# Patient Record
Sex: Male | Born: 1985 | Race: Black or African American | Hispanic: No | Marital: Single | Smoking: Never smoker
Health system: Southern US, Community
[De-identification: ages and names within clinical notes are randomized; demographics above are authoritative.]

---

## 2018-08-03 ENCOUNTER — Emergency Department
Admission: EM | Admit: 2018-08-03 | Discharge: 2018-08-03 | Disposition: A | Discharge status: Home / Self Care | Payer: Self-pay | Attending: Emergency Medicine | Admitting: Emergency Medicine

## 2018-08-03 ENCOUNTER — Encounter: Payer: Self-pay | Admitting: Emergency Medicine

## 2018-08-03 ENCOUNTER — Other Ambulatory Visit: Payer: Self-pay

## 2018-08-03 DIAGNOSIS — Z7689 Persons encountering health services in other specified circumstances: Secondary | ICD-10-CM

## 2018-08-03 DIAGNOSIS — Z0279 Encounter for issue of other medical certificate: Secondary | ICD-10-CM | POA: Insufficient documentation

## 2018-08-03 NOTE — ED Notes (Signed)
Reports just needs a note for work, reports had abd pain and stayed home from work, now can not return to work without medical clearance.

## 2018-08-03 NOTE — ED Provider Notes (Signed)
[facility] Emergency Department Provider Note  ____________________________________________   First MD Initiated Contact with Patient 08/03/18 1402     (approximate)  I have reviewed the triage vital signs and the nursing notes.   HISTORY  Chief Complaint work note    HPI Adam Robinson is a 33 y.o. male presents emergency department requesting a work note.  He states he had abdominal pain with a lot of gas and stayed home from work.  He states his boss told him he cannot return without medical clearance.  He has had no vomiting or diarrhea.  No other abdominal pain since that day.    History reviewed. No pertinent past medical history.  There are no active problems to display for this patient.   History reviewed. No pertinent surgical history.  Prior to Admission medications   Not on File    Allergies Penicillins  No family history on file.  Social History Social History   Tobacco Use   Smoking status: Not on file  Substance Use Topics   Alcohol use: Not on file   Drug use: Not on file    Review of Systems  Constitutional: No fever/chills, requesting work note Eyes: No visual changes. ENT: No sore throat. Respiratory: Denies cough Genitourinary: Negative for dysuria. Musculoskeletal: Negative for back pain. Skin: Negative for rash.    ____________________________________________   PHYSICAL EXAM:  VITAL SIGNS: ED Triage Vitals  Enc Vitals Group     BP 08/03/18 1341 107/70     Pulse Rate 08/03/18 1341 79     Resp 08/03/18 1341 16     Temp 08/03/18 1341 98.7 F (37.1 C)     Temp Source 08/03/18 1341 Oral     SpO2 08/03/18 1341 97 %     Weight --      Height --      Head Circumference --      Peak Flow --      Pain Score 08/03/18 1335 0     Pain Loc --      Pain Edu? --      Excl. in [place]? --     Constitutional: Alert and oriented. Well appearing and in no acute distress. Eyes: Conjunctivae are normal.   Head: Atraumatic. Nose: No congestion/rhinnorhea. Mouth/Throat: Mucous membranes are moist.   Neck:  supple no lymphadenopathy noted Cardiovascular: Normal rate, regular rhythm.  Respiratory: Normal respiratory effort.  No retractions,  Abd: soft nontender bs normal all 4 quad GU: deferred Musculoskeletal: FROM all extremities, warm and well perfused Neurologic:  Normal speech and language.  Skin:  Skin is warm, dry and intact. No rash noted. Psychiatric: Mood and affect are normal. Speech and behavior are normal.  ____________________________________________   LABS (all labs ordered are listed, but only abnormal results are displayed)  Labs Reviewed - No data to display ____________________________________________   ____________________________________________  RADIOLOGY    ____________________________________________   PROCEDURES  Procedure(s) performed: No  Procedures    ____________________________________________   INITIAL IMPRESSION / ASSESSMENT AND PLAN / ED COURSE  Pertinent labs & imaging results that were available during my care of the patient were reviewed by me and considered in my medical decision making (see chart for details).   Patient is 33 year old male presents emergency department requesting a work note.  He had gas and his employer told him he needed to be evaluated with a doctor's note for return to work.  Physical exam patient appears well.  Patient was given a  work note.  He was discharged stable condition.    Adam Robinson was evaluated in Emergency Department on 08/03/2018 for the symptoms described in the history of present illness. He was evaluated in the context of the global COVID-19 pandemic, which necessitated consideration that the patient might be at risk for infection with the SARS-CoV-2 virus that causes COVID-19. Institutional protocols and algorithms that pertain to the evaluation of patients at risk for COVID-19 are in a  state of rapid change based on information released by regulatory bodies including the CDC and federal and state organizations. These policies and algorithms were followed during the patient's care in the ED.   As part of my medical decision making, I reviewed the following data within the electronic MEDICAL RECORD NUMBER Nursing notes reviewed and incorporated, Old chart reviewed, Notes from prior ED visits and [place] Controlled Substance Database  ____________________________________________   FINAL CLINICAL IMPRESSION(S) / ED DIAGNOSES  Final diagnoses:  Return to work evaluation      NEW MEDICATIONS STARTED DURING THIS VISIT:  New Prescriptions   No medications on file     Note:  This document was prepared using Dragon voice recognition software and may include unintentional dictation errors.    Faythe Ghee, PA-C 08/03/18 1420    Sharyn Creamer, MD 08/03/18 [phone]

## 2018-08-03 NOTE — ED Notes (Signed)
Patient evaluated and discharged to home.

## 2018-08-03 NOTE — Discharge Instructions (Signed)
Follow-up with your regular doctor or the acute care as needed.

## 2018-08-03 NOTE — ED Triage Notes (Signed)
Pt to ED via POV, pt states that on Thursday he had an upset stomach and had to leave work. Needs note so that he can go back to work. Pt is in NAD, has no symptoms at this time.

## 2019-06-26 ENCOUNTER — Ambulatory Visit: Payer: Self-pay
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
## 2019-06-30 ENCOUNTER — Emergency Department
Admission: EM | Admit: 2019-06-30 | Discharge: 2019-06-30 | Disposition: A | Discharge status: Home / Self Care | Payer: Self-pay | Attending: Emergency Medicine | Admitting: Emergency Medicine

## 2019-06-30 ENCOUNTER — Other Ambulatory Visit: Payer: Self-pay

## 2019-06-30 DIAGNOSIS — B9689 Other specified bacterial agents as the cause of diseases classified elsewhere: Secondary | ICD-10-CM | POA: Insufficient documentation

## 2019-06-30 DIAGNOSIS — F17200 Nicotine dependence, unspecified, uncomplicated: Secondary | ICD-10-CM | POA: Insufficient documentation

## 2019-06-30 DIAGNOSIS — L089 Local infection of the skin and subcutaneous tissue, unspecified: Secondary | ICD-10-CM | POA: Insufficient documentation

## 2019-06-30 MED ORDER — SULFAMETHOXAZOLE-TRIMETHOPRIM 800-160 MG PO TABS: 1.0000 | ORAL_TABLET | Freq: Two times a day (BID) | ORAL | 0 refills | Status: AC

## 2019-06-30 MED ORDER — HYDROXYZINE HCL 50 MG PO TABS: 50.0000 mg | ORAL_TABLET | Freq: Three times a day (TID) | ORAL | 0 refills | Status: AC | PRN
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
## 2019-06-30 NOTE — ED Notes (Signed)
See triage note, pt states "my lip is swollen, I'm not sure why". States swelling started 2 weeks ago. States he tried to cut it open to help it drain a couple days ago with a knife, states he cleaned it with peroxide and burned it beforehand.   Denies drainage from lip.  No swelling noted to lip at this time.
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
## 2019-06-30 NOTE — Discharge Instructions (Signed)
Follow discharge care instruction take medication as directed. °

## 2019-10-13 ENCOUNTER — Telehealth: Payer: Self-pay | Admitting: General Practice

## 2019-10-13 NOTE — Telephone Encounter (Signed)
Individual has been contacted 3+ times regarding ED referral. No further attempts to contact individual will be made. 

## 2019-11-16 ENCOUNTER — Emergency Department
Admission: EM | Admit: 2019-11-16 | Discharge: 2019-11-16 | Disposition: A | Discharge status: Home / Self Care | Payer: Self-pay | Attending: Emergency Medicine | Admitting: Emergency Medicine

## 2019-11-16 ENCOUNTER — Other Ambulatory Visit: Payer: Self-pay

## 2019-11-16 ENCOUNTER — Encounter: Payer: Self-pay | Admitting: Emergency Medicine

## 2019-11-16 ENCOUNTER — Emergency Department: Payer: Self-pay

## 2019-11-16 DIAGNOSIS — L03012 Cellulitis of left finger: Secondary | ICD-10-CM

## 2019-11-16 DIAGNOSIS — F17200 Nicotine dependence, unspecified, uncomplicated: Secondary | ICD-10-CM | POA: Insufficient documentation

## 2019-11-16 DIAGNOSIS — W228XXA Striking against or struck by other objects, initial encounter: Secondary | ICD-10-CM | POA: Insufficient documentation

## 2019-11-16 DIAGNOSIS — M86042 Acute hematogenous osteomyelitis, left hand: Secondary | ICD-10-CM

## 2019-11-16 DIAGNOSIS — Y999 Unspecified external cause status: Secondary | ICD-10-CM | POA: Insufficient documentation

## 2019-11-16 DIAGNOSIS — Y9389 Activity, other specified: Secondary | ICD-10-CM | POA: Insufficient documentation

## 2019-11-16 DIAGNOSIS — Y929 Unspecified place or not applicable: Secondary | ICD-10-CM | POA: Insufficient documentation

## 2019-11-16 LAB — CBC
HCT: 38.5 % — ABNORMAL LOW (ref 39.0–52.0)
Hemoglobin: 13.2 g/dL (ref 13.0–17.0)
MCH: 27.7 pg (ref 26.0–34.0)
MCHC: 34.3 g/dL (ref 30.0–36.0)
MCV: 80.9 fL (ref 80.0–100.0)
Platelets: 308 10*3/uL (ref 150–400)
RBC: 4.76 MIL/uL (ref 4.22–5.81)
RDW: 14.1 % (ref 11.5–15.5)
WBC: 7.6 10*3/uL (ref 4.0–10.5)
nRBC: 0 % (ref 0.0–0.2)

## 2019-11-16 LAB — SEDIMENTATION RATE: Sed Rate: 82 mm/hr — ABNORMAL HIGH (ref 0–15)

## 2019-11-16 LAB — C-REACTIVE PROTEIN: CRP: 0.7 mg/dL (ref <1.0)

## 2019-11-16 MED ORDER — KETOROLAC TROMETHAMINE 30 MG/ML IJ SOLN
30.0000 mg | Freq: Once | INTRAMUSCULAR | Status: AC
  Administered 2019-11-16: 30 mg via INTRAVENOUS
  Filled 2019-11-16: qty 1

## 2019-11-16 MED ORDER — LIDOCAINE HCL (PF) 1 % IJ SOLN
5.0000 mL | Freq: Once | INTRAMUSCULAR | Status: AC
  Administered 2019-11-16: 5 mL via INTRADERMAL
  Filled 2019-11-16: qty 5

## 2019-11-16 MED ORDER — OXYCODONE-ACETAMINOPHEN 5-325 MG PO TABS
1.0000 | ORAL_TABLET | Freq: Once | ORAL | Status: AC
  Administered 2019-11-16: 1 via ORAL
  Filled 2019-11-16: qty 1

## 2019-11-16 MED ORDER — OXYCODONE-ACETAMINOPHEN 5-325 MG PO TABS: 1.0000 | ORAL_TABLET | ORAL | 0 refills | Status: AC | PRN

## 2019-11-16 MED ORDER — LIDOCAINE HCL (PF) 1 % IJ SOLN
INTRAMUSCULAR | Status: AC
  Filled 2019-11-16: qty 5

## 2019-11-16 MED ORDER — SODIUM CHLORIDE 0.9 % IV SOLN
1.0000 g | Freq: Once | INTRAVENOUS | Status: AC
  Administered 2019-11-16: 1 g via INTRAVENOUS
  Filled 2019-11-16: qty 10

## 2019-11-16 MED ORDER — CEPHALEXIN 500 MG PO CAPS: 500.0000 mg | ORAL_CAPSULE | Freq: Four times a day (QID) | ORAL | 0 refills | Status: AC

## 2019-11-16 MED ORDER — DOXYCYCLINE HYCLATE 100 MG PO TABS
100.0000 mg | ORAL_TABLET | Freq: Once | ORAL | Status: AC
  Administered 2019-11-16: 100 mg via ORAL
  Filled 2019-11-16: qty 1

## 2019-11-16 MED ORDER — MORPHINE SULFATE (PF) 4 MG/ML IV SOLN
4.0000 mg | Freq: Once | INTRAVENOUS | Status: AC
  Administered 2019-11-16: 4 mg via INTRAVENOUS
  Filled 2019-11-16: qty 1

## 2019-11-16 MED ORDER — DOXYCYCLINE HYCLATE 100 MG PO TBEC: 100.0000 mg | DELAYED_RELEASE_TABLET | Freq: Two times a day (BID) | ORAL | 0 refills | Status: AC

## 2019-11-16 MED ORDER — ONDANSETRON HCL 4 MG/2ML IJ SOLN: 4.0000 mg | Freq: Once | INTRAMUSCULAR | Status: AC

## 2019-11-16 MED ORDER — ONDANSETRON HCL 4 MG/2ML IJ SOLN
INTRAMUSCULAR | Status: AC
  Administered 2019-11-16: 4 mg via INTRAVENOUS
  Filled 2019-11-16: qty 2

## 2019-11-16 NOTE — ED Provider Notes (Signed)
[facility] Emergency Department Provider Note  ____________________________________________   First MD Initiated Contact with Patient 11/16/19 1334     (approximate)  I have reviewed the triage vital signs and the nursing notes.   HISTORY  Chief Complaint Hand Pain   HPI Adam Robinson is a 34 y.o. male who presents to the emergency department for evaluation of left thumb pain. The patient states that he hit it on something approximately a week ago and for the last 5 to 6 days has had increasing pain and swelling about the distal left thumb. He notes discoloration under the nail bed and is concerned for infection. He denies weakness, fever, shortness of breath.         History reviewed. No pertinent past medical history.  There are no problems to display for this patient.   History reviewed. No pertinent surgical history.  Prior to Admission medications   Medication Sig Start Date End Date Taking? Authorizing Provider  cephALEXin (KEFLEX) 500 MG capsule Take 1 capsule (500 mg total) by mouth 4 (four) times daily for 10 days. 11/16/19 11/26/19  Lucy Chris, PA  doxycycline (DORYX) 100 MG EC tablet Take 1 tablet (100 mg total) by mouth 2 (two) times daily for 10 days. 11/16/19 11/26/19  Lucy Chris, PA  hydrOXYzine (ATARAX/VISTARIL) 50 MG tablet Take 1 tablet (50 mg total) by mouth 3 (three) times daily as needed for itching. 06/30/19   Joni Reining, PA-C  oxyCODONE-acetaminophen (PERCOCET) 5-325 MG tablet Take 1 tablet by mouth every 4 (four) hours as needed for severe pain. 11/16/19 11/15/20  Lucy Chris, PA  sulfamethoxazole-trimethoprim (BACTRIM DS) 800-160 MG tablet Take 1 tablet by mouth 2 (two) times daily. 06/30/19   Joni Reining, PA-C    Allergies Penicillins  No family history on file.  Social History Social History   Tobacco Use  . Smoking status: Current Every Day Smoker  Substance Use Topics  . Alcohol use: Yes  .  Drug use: Not on file    Review of Systems Constitutional: No fever/chills Eyes: No visual changes. ENT: No sore throat. Cardiovascular: Denies chest pain. Respiratory: Denies shortness of breath. Gastrointestinal: No abdominal pain.  No nausea, no vomiting.  No diarrhea.  No constipation. Genitourinary: Negative for dysuria. Musculoskeletal: + Left thumb pain, negative for back pain. Skin: Negative for rash. Neurological: Negative for headaches, focal weakness or numbness.   ____________________________________________   PHYSICAL EXAM:  VITAL SIGNS: ED Triage Vitals  Enc Vitals Group     BP 11/16/19 1054 120/85     Pulse Rate 11/16/19 1054 62     Resp 11/16/19 1054 17     Temp 11/16/19 1054 98.4 F (36.9 C)     Temp Source 11/16/19 1054 Oral     SpO2 11/16/19 1054 99 %     Weight 11/16/19 0951 175 lb (79.4 kg)     Height 11/16/19 0951 5\' 9"  (1.753 m)     Head Circumference --      Peak Flow --      Pain Score 11/16/19 0950 5     Pain Loc --      Pain Edu? --      Excl. in [place]? --     Constitutional: Alert and oriented. Well appearing and in no acute distress. Eyes: Conjunctivae are normal.  Head: Atraumatic. Nose: No congestion/rhinnorhea. Mouth/Throat: Mucous membranes are moist.  Oropharynx non-erythematous. Neck: No stridor.   Cardiovascular: Normal rate, regular rhythm.  Grossly normal heart sounds.  Good peripheral circulation. Respiratory: Normal respiratory effort.  No retractions. Lungs CTAB. Musculoskeletal: There is a moderate amount of swelling about the distal left thumb. There is a yellow color discoloration approximately halfway back the nail bed. The skin to the distal aspect of the thumb is a green/yellow color. It is tender to palpation over the distal phalanx of the left thumb. Neurologic:  Normal speech and language. No gross focal neurologic deficits are appreciated. No gait instability. Skin:  Skin is warm, dry and intact except as described  above. No rash noted. Psychiatric: Mood and affect are normal. Speech and behavior are normal.  ____________________________________________   LABS (all labs ordered are listed, but only abnormal results are displayed)  Labs Reviewed  CBC - Abnormal; Notable for the following components:      Result Value   HCT 38.5 (*)    All other components within normal limits  SEDIMENTATION RATE - Abnormal; Notable for the following components:   Sed Rate 82 (*)    All other components within normal limits  C-REACTIVE PROTEIN    ____________________________________________  RADIOLOGY   Official radiology report(s): DG Finger Thumb Left  Result Date: 11/16/2019 CLINICAL DATA:  Left thumb redness, pain, and swelling EXAM: LEFT THUMB 2+V COMPARISON:  None. FINDINGS: Small focal erosion of the distal tuft of the left thumb distal phalanx along its volar aspect centrally. There is associated soft tissue prominence. No soft tissue gas. No acute fracture. Joint spaces are maintained. No dislocation. IMPRESSION: Small focal erosion of the distal tuft of the left thumb distal phalanx suspicious for acute osteomyelitis. Electronically Signed   By: Duanne Guess D.O.   On: 11/16/2019 12:35    ____________________________________________   PROCEDURES  Procedure(s) performed (including Critical Care):  Procedures: See ED provider note from Dr. Erma Heritage.   ____________________________________________   INITIAL IMPRESSION / ASSESSMENT AND PLAN / ED COURSE  As part of my medical decision making, I reviewed the following data within the electronic MEDICAL RECORD NUMBER Nursing notes reviewed and incorporated, Radiograph reviewed of the left thumb, A consult was requested and obtained from this/these consultant(s) Orthopedics and [place] Controlled Substance Database        Adam Robinson is a 34 year old male who presents to the emergency department for acute left thumb pain x5 to 6 days. He states he  hit it on something and a few days later the pain and swelling started. On physical exam, the patient has a tender distal left thumb with obvious pus formation underneath the nailbed. X-rays performed and is concerning for early osteomyelitis of the distal phalanx. Adam Robinson from orthopedics was consulted, who recommended draining the felon and placing on antibiotics for outpatient follow-up. The case was discussed with Adam Pollack, MD who agreed to assist on the drainage procedure. See his procedure note. The patient was then given IV Rocephin as well as p.o. doxycycline before leaving the facility, and was given a prescription for p.o. Keflex and p.o. doxycycline x10 days as well as Percocet for home until he can follow-up with orthopedics. The patient is amenable with this plan.  Adam Robinson was evaluated in Emergency Department on 11/16/2019 for the symptoms described in the history of present illness. He was evaluated in the context of the global COVID-19 pandemic, which necessitated consideration that the patient might be at risk for infection with the SARS-CoV-2 virus that causes COVID-19. Institutional protocols and algorithms that pertain to the evaluation of patients at risk for  COVID-19 are in a state of rapid change based on information released by regulatory bodies including the CDC and federal and state organizations. These policies and algorithms were followed during the patient's care in the ED.       ____________________________________________   FINAL CLINICAL IMPRESSION(S) / ED DIAGNOSES  Final diagnoses:  Felon of finger of left hand  Acute hematogenous osteomyelitis of left hand [facility])     ED Discharge Orders         Ordered    cephALEXin (KEFLEX) 500 MG capsule  4 times daily        11/16/19 1510    doxycycline (DORYX) 100 MG EC tablet  2 times daily        11/16/19 1510    oxyCODONE-acetaminophen (PERCOCET) 5-325 MG tablet  Every 4 hours PRN        11/16/19  1510           Note:  This document was prepared using Dragon voice recognition software and may include unintentional dictation errors.    Lucy Chris, PA 11/16/19 1743    Adam Pollack, MD 11/18/19 1519    Adam Pollack, MD 11/18/19 [phone]

## 2019-11-16 NOTE — Consult Note (Signed)
Called by PA in ER regarding this 34 y/o male with approx 5 days of left thumb pain.  PA describes that the distal phalanx has the appearance of a felon.  I have recommended the ER staff drain the felon and put patient on oral antibiotics and have the patient follow up in our office later this week.   I have reviewed the xrays.  Given his young age I would not recommend surgical intervention immediately for a question of a small erosion in the tuft of the thumb, but rather treat initially with antibiotics.  Objective:   VITALS:   Vitals:   11/16/19 0951 11/16/19 1054  BP:  120/85  Pulse:  62  Resp:  17  Temp:  98.4 F (36.9 C)  TempSrc:  Oral  SpO2:  99%  Weight: 79.4 kg   Height: 5\' 9"  (1.753 m)     LABS  No results found for this or any previous visit (from the past 24 hour(s)).  DG Finger Thumb Left  Result Date: 11/16/2019 CLINICAL DATA:  Left thumb redness, pain, and swelling EXAM: LEFT THUMB 2+V COMPARISON:  None. FINDINGS: Small focal erosion of the distal tuft of the left thumb distal phalanx along its volar aspect centrally. There is associated soft tissue prominence. No soft tissue gas. No acute fracture. Joint spaces are maintained. No dislocation. IMPRESSION: Small focal erosion of the distal tuft of the left thumb distal phalanx suspicious for acute osteomyelitis. Electronically Signed   By: 11/18/2019 D.O.   On: 11/16/2019 12:35    Assessment/Plan:    Reported left thumb felon  Recommend I&D of left thumb felon.  Culture any fluid from I&D Discharge on antibiotics.   Follow up in the office this week.  [facility] [phone].  [phone] , MD 11/16/2019, 1:35 PM

## 2019-11-16 NOTE — ED Triage Notes (Signed)
Pt reports thinks he has an infection to his left hand thumb. Pt reports painful and swollen for 5=6 days.

## 2019-11-18 NOTE — ED Provider Notes (Signed)
..  Incision and Drainage  Date/Time: 11/18/2019 3:20 PM Performed by: Shaune Pollack, MD Authorized by: Shaune Pollack, MD   Consent:    Consent obtained:  Verbal   Consent given by:  Patient   Risks discussed:  Bleeding, damage to other organs, incomplete drainage, infection and pain   Alternatives discussed:  Alternative treatment and delayed treatment Location:    Type:  Abscess   Location:  Upper extremity   Upper extremity location:  Finger   Finger location:  L thumb Pre-procedure details:    Skin preparation:  Betadine Anesthesia (see MAR for exact dosages):    Anesthesia method:  Nerve block   Block needle gauge:  27 G   Block anesthetic:  Lidocaine 2% w/o epi   Block technique:  Digital block   Block injection procedure:  Anatomic landmarks palpated, anatomic landmarks identified, introduced needle, negative aspiration for blood and incremental injection   Block outcome:  Anesthesia achieved Procedure type:    Complexity:  Complex Procedure details:    Needle aspiration: no     Incision types:  Single straight   Incision depth:  Dermal   Scalpel blade:  11   Wound management:  Probed and deloculated and irrigated with saline   Drainage:  Purulent   Drainage amount:  Moderate   Wound treatment:  Wound left open   Packing materials:  None Post-procedure details:    Patient tolerance of procedure:  Tolerated well, no immediate complications      Shaune Pollack, MD 11/18/19 1521

## 2022-05-10 ENCOUNTER — Emergency Department (HOSPITAL_COMMUNITY): Payer: BC Managed Care – PPO

## 2022-05-10 ENCOUNTER — Other Ambulatory Visit: Payer: Self-pay

## 2022-05-10 ENCOUNTER — Encounter (HOSPITAL_COMMUNITY)
Admission: EM | Disposition: A | Discharge status: Other Acute Inpatient Hospital | Payer: Self-pay | Source: Home / Self Care | Attending: Emergency Medicine

## 2022-05-10 ENCOUNTER — Observation Stay (HOSPITAL_COMMUNITY)
Admission: EM | Admit: 2022-05-10 | Discharge: 2022-05-15 | Disposition: A | Discharge status: Other Acute Inpatient Hospital | Payer: BC Managed Care – PPO | Attending: Surgery | Admitting: Surgery

## 2022-05-10 ENCOUNTER — Encounter (HOSPITAL_COMMUNITY): Payer: Self-pay

## 2022-05-10 ENCOUNTER — Observation Stay (HOSPITAL_COMMUNITY): Payer: BC Managed Care – PPO | Admitting: Certified Registered Nurse Anesthetist

## 2022-05-10 DIAGNOSIS — F6589 Other paraphilias: Secondary | ICD-10-CM | POA: Diagnosis present

## 2022-05-10 DIAGNOSIS — X789XXA Intentional self-harm by unspecified sharp object, initial encounter: Secondary | ICD-10-CM | POA: Diagnosis present

## 2022-05-10 DIAGNOSIS — S1191XA Laceration without foreign body of unspecified part of neck, initial encounter: Secondary | ICD-10-CM | POA: Diagnosis present

## 2022-05-10 DIAGNOSIS — Z7289 Other problems related to lifestyle: Secondary | ICD-10-CM

## 2022-05-10 DIAGNOSIS — Z79899 Other long term (current) drug therapy: Secondary | ICD-10-CM | POA: Diagnosis not present

## 2022-05-10 DIAGNOSIS — W260XXA Contact with knife, initial encounter: Secondary | ICD-10-CM | POA: Diagnosis not present

## 2022-05-10 DIAGNOSIS — Z1152 Encounter for screening for COVID-19: Secondary | ICD-10-CM | POA: Diagnosis not present

## 2022-05-10 DIAGNOSIS — S1183XA Puncture wound without foreign body of other specified part of neck, initial encounter: Secondary | ICD-10-CM | POA: Diagnosis present

## 2022-05-10 DIAGNOSIS — Z23 Encounter for immunization: Secondary | ICD-10-CM | POA: Diagnosis not present

## 2022-05-10 HISTORY — PX: I & D EXTREMITY: SHX5045

## 2022-05-10 LAB — ETHANOL: Alcohol, Ethyl (B): <10 mg/dL (ref <10)

## 2022-05-10 LAB — CBC
HCT: 42.7 % (ref 39.0–52.0)
HCT: 40 % (ref 39.0–52.0)
Hemoglobin: 13.9 g/dL (ref 13.0–17.0)
Hemoglobin: 13.8 g/dL (ref 13.0–17.0)
MCH: 27.4 pg (ref 26.0–34.0)
MCH: 27.7 pg (ref 26.0–34.0)
MCHC: 32.6 g/dL (ref 30.0–36.0)
MCHC: 34.5 g/dL (ref 30.0–36.0)
MCV: 84.2 fL (ref 80.0–100.0)
MCV: 80.2 fL (ref 80.0–100.0)
Platelets: 370 10*3/uL (ref 150–400)
Platelets: 354 10*3/uL (ref 150–400)
RBC: 5.07 MIL/uL (ref 4.22–5.81)
RBC: 4.99 MIL/uL (ref 4.22–5.81)
RDW: 13.5 % (ref 11.5–15.5)
RDW: 13.5 % (ref 11.5–15.5)
WBC: 12.3 10*3/uL — ABNORMAL HIGH (ref 4.0–10.5)
WBC: 13.4 10*3/uL — ABNORMAL HIGH (ref 4.0–10.5)
nRBC: 0 % (ref 0.0–0.2)
nRBC: 0 % (ref 0.0–0.2)

## 2022-05-10 LAB — SALICYLATE LEVEL: Salicylate Lvl: <7 mg/dL — ABNORMAL LOW (ref 7.0–30.0)

## 2022-05-10 LAB — CREATININE, SERUM
Creatinine, Ser: 1.05 mg/dL (ref 0.61–1.24)
GFR, Estimated: >60 mL/min (ref >60)

## 2022-05-10 LAB — HIV ANTIBODY (ROUTINE TESTING W REFLEX): HIV Screen 4th Generation wRfx: NONREACTIVE

## 2022-05-10 LAB — COMPREHENSIVE METABOLIC PANEL
ALT: 29 U/L (ref 0–44)
AST: 34 U/L (ref 15–41)
Albumin: 4.2 g/dL (ref 3.5–5.0)
Alkaline Phosphatase: 50 U/L (ref 38–126)
Anion gap: 10 (ref 5–15)
BUN: 15 mg/dL (ref 6–20)
CO2: 24 mmol/L (ref 22–32)
Calcium: 9.4 mg/dL (ref 8.9–10.3)
Chloride: 105 mmol/L (ref 98–111)
Creatinine, Ser: 1.27 mg/dL — ABNORMAL HIGH (ref 0.61–1.24)
GFR, Estimated: >60 mL/min (ref >60)
Glucose, Bld: 134 mg/dL — ABNORMAL HIGH (ref 70–99)
Potassium: 3.9 mmol/L (ref 3.5–5.1)
Sodium: 139 mmol/L (ref 135–145)
Total Bilirubin: 0.4 mg/dL (ref 0.3–1.2)
Total Protein: 7.8 g/dL (ref 6.5–8.1)

## 2022-05-10 LAB — PROTIME-INR
INR: 1 (ref 0.8–1.2)
Prothrombin Time: 13.2 seconds (ref 11.4–15.2)

## 2022-05-10 LAB — ACETAMINOPHEN LEVEL: Acetaminophen (Tylenol), Serum: 10 ug/mL (ref 10–30)

## 2022-05-10 LAB — I-STAT CHEM 8, ED
BUN: 17 mg/dL (ref 6–20)
Calcium, Ion: 1.13 mmol/L — ABNORMAL LOW (ref 1.15–1.40)
Chloride: 106 mmol/L (ref 98–111)
Creatinine, Ser: 1.2 mg/dL (ref 0.61–1.24)
Glucose, Bld: 132 mg/dL — ABNORMAL HIGH (ref 70–99)
HCT: 43 % (ref 39.0–52.0)
Hemoglobin: 14.6 g/dL (ref 13.0–17.0)
Potassium: 3.7 mmol/L (ref 3.5–5.1)
Sodium: 143 mmol/L (ref 135–145)
TCO2: 25 mmol/L (ref 22–32)

## 2022-05-10 LAB — SAMPLE TO BLOOD BANK

## 2022-05-10 LAB — LACTIC ACID, PLASMA: Lactic Acid, Venous: 1.6 mmol/L (ref 0.5–1.9)

## 2022-05-10 SURGERY — IRRIGATION AND DEBRIDEMENT EXTREMITY
Anesthesia: General | Site: Neck

## 2022-05-10 MED ORDER — FENTANYL CITRATE (PF) 250 MCG/5ML IJ SOLN
INTRAMUSCULAR | Status: DC | PRN
  Administered 2022-05-10 (×3): 50 ug via INTRAVENOUS

## 2022-05-10 MED ORDER — OXYCODONE HCL 5 MG PO TABS
5.0000 mg | ORAL_TABLET | ORAL | Status: DC | PRN
  Administered 2022-05-10 – 2022-05-13 (×2): 10 mg via ORAL
  Administered 2022-05-14: 5 mg via ORAL
  Administered 2022-05-14: 10 mg via ORAL
  Filled 2022-05-10: qty 2
  Filled 2022-05-10: qty 1
  Filled 2022-05-10 (×2): qty 2

## 2022-05-10 MED ORDER — SUGAMMADEX SODIUM 200 MG/2ML IV SOLN
INTRAVENOUS | Status: DC | PRN
  Administered 2022-05-10: 200 mg via INTRAVENOUS

## 2022-05-10 MED ORDER — SUCCINYLCHOLINE CHLORIDE 200 MG/10ML IV SOSY
PREFILLED_SYRINGE | INTRAVENOUS | Status: DC | PRN
  Administered 2022-05-10: 140 mg via INTRAVENOUS

## 2022-05-10 MED ORDER — PHENYLEPHRINE 80 MCG/ML (10ML) SYRINGE FOR IV PUSH (FOR BLOOD PRESSURE SUPPORT)
PREFILLED_SYRINGE | INTRAVENOUS | Status: DC | PRN
  Administered 2022-05-10: 160 ug via INTRAVENOUS

## 2022-05-10 MED ORDER — ONDANSETRON HCL 4 MG/2ML IJ SOLN
INTRAMUSCULAR | Status: AC
  Filled 2022-05-10: qty 4

## 2022-05-10 MED ORDER — IOHEXOL 350 MG/ML SOLN
75.0000 mL | Freq: Once | INTRAVENOUS | Status: AC | PRN
  Administered 2022-05-10: 75 mL via INTRAVENOUS

## 2022-05-10 MED ORDER — DEXAMETHASONE SODIUM PHOSPHATE 10 MG/ML IJ SOLN
INTRAMUSCULAR | Status: AC
  Filled 2022-05-10: qty 1

## 2022-05-10 MED ORDER — ACETAMINOPHEN 325 MG PO TABS
650.0000 mg | ORAL_TABLET | Freq: Four times a day (QID) | ORAL | Status: DC
  Administered 2022-05-10 – 2022-05-15 (×6): 650 mg via ORAL
  Filled 2022-05-10 (×8): qty 2

## 2022-05-10 MED ORDER — TETANUS-DIPHTH-ACELL PERTUSSIS 5-2.5-18.5 LF-MCG/0.5 IM SUSY
0.5000 mL | PREFILLED_SYRINGE | Freq: Once | INTRAMUSCULAR | Status: AC
  Administered 2022-05-10: 0.5 mL via INTRAMUSCULAR

## 2022-05-10 MED ORDER — LIDOCAINE 2% (20 MG/ML) 5 ML SYRINGE
INTRAMUSCULAR | Status: DC | PRN
  Administered 2022-05-10: 80 mg via INTRAVENOUS

## 2022-05-10 MED ORDER — FENTANYL CITRATE (PF) 250 MCG/5ML IJ SOLN
INTRAMUSCULAR | Status: AC
  Filled 2022-05-10: qty 5

## 2022-05-10 MED ORDER — DEXMEDETOMIDINE HCL IN NACL 80 MCG/20ML IV SOLN
INTRAVENOUS | Status: DC | PRN
  Administered 2022-05-10: 4 ug via BUCCAL
  Administered 2022-05-10 (×3): 8 ug via BUCCAL

## 2022-05-10 MED ORDER — DOCUSATE SODIUM 100 MG PO CAPS
100.0000 mg | ORAL_CAPSULE | Freq: Two times a day (BID) | ORAL | Status: DC
  Administered 2022-05-10 – 2022-05-14 (×5): 100 mg via ORAL
  Filled 2022-05-10 (×7): qty 1

## 2022-05-10 MED ORDER — METOPROLOL TARTRATE 5 MG/5ML IV SOLN: 5.0000 mg | Freq: Four times a day (QID) | INTRAVENOUS | Status: DC | PRN

## 2022-05-10 MED ORDER — SODIUM CHLORIDE 0.9 % IR SOLN
Status: DC | PRN
  Administered 2022-05-10: 3000 mL

## 2022-05-10 MED ORDER — MIDAZOLAM HCL 2 MG/2ML IJ SOLN
INTRAMUSCULAR | Status: DC | PRN
  Administered 2022-05-10: 2 mg via INTRAVENOUS

## 2022-05-10 MED ORDER — CHLORHEXIDINE GLUCONATE 0.12 % MT SOLN
OROMUCOSAL | Status: AC
  Administered 2022-05-10: 15 mL via OROMUCOSAL
  Filled 2022-05-10: qty 15

## 2022-05-10 MED ORDER — 0.9 % SODIUM CHLORIDE (POUR BTL) OPTIME
TOPICAL | Status: DC | PRN
  Administered 2022-05-10: 1000 mL

## 2022-05-10 MED ORDER — CEFAZOLIN SODIUM-DEXTROSE 2-4 GM/100ML-% IV SOLN
2.0000 g | Freq: Once | INTRAVENOUS | Status: AC
  Administered 2022-05-10: 2 g via INTRAVENOUS

## 2022-05-10 MED ORDER — HYDROMORPHONE HCL 1 MG/ML IJ SOLN: 0.5000 mg | INTRAMUSCULAR | Status: DC | PRN

## 2022-05-10 MED ORDER — CHLORHEXIDINE GLUCONATE 0.12 % MT SOLN: 15.0000 mL | Freq: Once | OROMUCOSAL | Status: AC

## 2022-05-10 MED ORDER — ONDANSETRON 4 MG PO TBDP: 4.0000 mg | ORAL_TABLET | Freq: Four times a day (QID) | ORAL | Status: DC | PRN

## 2022-05-10 MED ORDER — LACTATED RINGERS IV SOLN: INTRAVENOUS | Status: DC

## 2022-05-10 MED ORDER — MIDAZOLAM HCL 2 MG/2ML IJ SOLN
INTRAMUSCULAR | Status: AC
  Filled 2022-05-10: qty 2

## 2022-05-10 MED ORDER — HEMOSTATIC AGENTS (NO CHARGE) OPTIME
TOPICAL | Status: DC | PRN
  Administered 2022-05-10: 1

## 2022-05-10 MED ORDER — ENOXAPARIN SODIUM 30 MG/0.3ML IJ SOSY
30.0000 mg | PREFILLED_SYRINGE | Freq: Two times a day (BID) | INTRAMUSCULAR | Status: DC
  Administered 2022-05-11 – 2022-05-14 (×3): 30 mg via SUBCUTANEOUS
  Filled 2022-05-10 (×5): qty 0.3

## 2022-05-10 MED ORDER — PROPOFOL 10 MG/ML IV BOLUS
INTRAVENOUS | Status: AC
  Filled 2022-05-10: qty 20

## 2022-05-10 MED ORDER — BUPIVACAINE HCL (PF) 0.25 % IJ SOLN
INTRAMUSCULAR | Status: AC
  Filled 2022-05-10: qty 30

## 2022-05-10 MED ORDER — DEXAMETHASONE SODIUM PHOSPHATE 10 MG/ML IJ SOLN
INTRAMUSCULAR | Status: DC | PRN
  Administered 2022-05-10: 10 mg via INTRAVENOUS

## 2022-05-10 MED ORDER — LIDOCAINE 2% (20 MG/ML) 5 ML SYRINGE
INTRAMUSCULAR | Status: AC
  Filled 2022-05-10: qty 10

## 2022-05-10 MED ORDER — ACETAMINOPHEN 10 MG/ML IV SOLN
1000.0000 mg | Freq: Once | INTRAVENOUS | Status: DC | PRN
  Administered 2022-05-10: 1000 mg via INTRAVENOUS

## 2022-05-10 MED ORDER — ACETAMINOPHEN 10 MG/ML IV SOLN
INTRAVENOUS | Status: AC
  Filled 2022-05-10: qty 100

## 2022-05-10 MED ORDER — MELATONIN 3 MG PO TABS
3.0000 mg | ORAL_TABLET | Freq: Every evening | ORAL | Status: DC | PRN
  Administered 2022-05-10 – 2022-05-12 (×3): 3 mg via ORAL
  Filled 2022-05-10 (×4): qty 1

## 2022-05-10 MED ORDER — ORAL CARE MOUTH RINSE: 15.0000 mL | Freq: Once | OROMUCOSAL | Status: AC

## 2022-05-10 MED ORDER — ONDANSETRON HCL 4 MG/2ML IJ SOLN
INTRAMUSCULAR | Status: DC | PRN
  Administered 2022-05-10: 4 mg via INTRAVENOUS

## 2022-05-10 MED ORDER — FENTANYL CITRATE (PF) 100 MCG/2ML IJ SOLN
25.0000 ug | INTRAMUSCULAR | Status: DC | PRN
  Administered 2022-05-10: 25 ug via INTRAVENOUS

## 2022-05-10 MED ORDER — ROCURONIUM BROMIDE 10 MG/ML (PF) SYRINGE
PREFILLED_SYRINGE | INTRAVENOUS | Status: DC | PRN
  Administered 2022-05-10: 50 mg via INTRAVENOUS

## 2022-05-10 MED ORDER — PROPOFOL 10 MG/ML IV BOLUS
INTRAVENOUS | Status: DC | PRN
  Administered 2022-05-10: 200 mg via INTRAVENOUS

## 2022-05-10 MED ORDER — HALOPERIDOL LACTATE 5 MG/ML IJ SOLN: 5.0000 mg | Freq: Four times a day (QID) | INTRAMUSCULAR | Status: DC | PRN

## 2022-05-10 MED ORDER — FENTANYL CITRATE (PF) 100 MCG/2ML IJ SOLN
INTRAMUSCULAR | Status: AC
  Filled 2022-05-10: qty 2

## 2022-05-10 MED ORDER — ONDANSETRON HCL 4 MG/2ML IJ SOLN: 4.0000 mg | Freq: Four times a day (QID) | INTRAMUSCULAR | Status: DC | PRN

## 2022-05-10 MED ORDER — CEFAZOLIN SODIUM-DEXTROSE 1-4 GM/50ML-% IV SOLN: 1.0000 g | Freq: Once | INTRAVENOUS | Status: DC

## 2022-05-10 SURGICAL SUPPLY — 35 items
BAG COUNTER SPONGE SURGICOUNT (BAG) ×1 IMPLANT
BNDG ELASTIC 4X5.8 VLCR STR LF (GAUZE/BANDAGES/DRESSINGS) IMPLANT
BNDG ELASTIC 6X5.8 VLCR STR LF (GAUZE/BANDAGES/DRESSINGS) IMPLANT
BNDG GAUZE DERMACEA FLUFF 4 (GAUZE/BANDAGES/DRESSINGS) IMPLANT
CANISTER SUCT 3000ML PPV (MISCELLANEOUS) ×1 IMPLANT
COVER SURGICAL LIGHT HANDLE (MISCELLANEOUS) ×1 IMPLANT
DERMABOND ADVANCED .7 DNX12 (GAUZE/BANDAGES/DRESSINGS) IMPLANT
DRAPE HALF SHEET 40X57 (DRAPES) IMPLANT
DRAPE LAPAROTOMY 100X72 PEDS (DRAPES) IMPLANT
ELECT REM PT RETURN 9FT ADLT (ELECTROSURGICAL) ×1
ELECTRODE REM PT RTRN 9FT ADLT (ELECTROSURGICAL) ×1 IMPLANT
GAUZE PAD ABD 8X10 STRL (GAUZE/BANDAGES/DRESSINGS) IMPLANT
GAUZE SPONGE 4X4 12PLY STRL LF (GAUZE/BANDAGES/DRESSINGS) ×1 IMPLANT
GLOVE BIOGEL M STRL SZ7.5 (GLOVE) ×1 IMPLANT
GLOVE INDICATOR 8.0 STRL GRN (GLOVE) ×2 IMPLANT
GOWN STRL REUS W/ TWL LRG LVL3 (GOWN DISPOSABLE) ×1 IMPLANT
GOWN STRL REUS W/TWL 2XL LVL3 (GOWN DISPOSABLE) ×1 IMPLANT
GOWN STRL REUS W/TWL LRG LVL3 (GOWN DISPOSABLE) ×1
HEMOSTAT SURGICEL 2X4 FIBR (HEMOSTASIS) IMPLANT
KIT BASIN OR (CUSTOM PROCEDURE TRAY) ×1 IMPLANT
KIT TURNOVER KIT B (KITS) ×1 IMPLANT
NS IRRIG 1000ML POUR BTL (IV SOLUTION) ×1 IMPLANT
PACK GENERAL/GYN (CUSTOM PROCEDURE TRAY) ×1 IMPLANT
PAD ARMBOARD 7.5X6 YLW CONV (MISCELLANEOUS) ×1 IMPLANT
PENCIL SMOKE EVACUATOR (MISCELLANEOUS) ×1 IMPLANT
SET CYSTO W/LG BORE CLAMP LF (SET/KITS/TRAYS/PACK) IMPLANT
STOCKINETTE IMPERVIOUS 9X36 MD (GAUZE/BANDAGES/DRESSINGS) IMPLANT
STOCKINETTE IMPERVIOUS LG (DRAPES) IMPLANT
SUCTION FRAZIER HANDLE 10FR (MISCELLANEOUS) ×1
SUCTION TUBE FRAZIER 10FR DISP (MISCELLANEOUS) IMPLANT
SUT MNCRL AB 4-0 PS2 18 (SUTURE) IMPLANT
SUT VIC AB 3-0 SH 8-18 (SUTURE) IMPLANT
TOWEL GREEN STERILE (TOWEL DISPOSABLE) ×1 IMPLANT
TOWEL GREEN STERILE FF (TOWEL DISPOSABLE) ×1 IMPLANT
UNDERPAD 30X36 HEAVY ABSORB (UNDERPADS AND DIAPERS) ×1 IMPLANT

## 2022-05-10 NOTE — ED Triage Notes (Addendum)
Pt BIBA from home Pt cut throat on left side, attempting to cut carotid. Trachea exposed. O2 100%. Pt can speak in full sentences 110 HR 126/97 17 RR

## 2022-05-10 NOTE — Plan of Care (Signed)
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
## 2022-05-10 NOTE — Progress Notes (Signed)
Patient has outstanding warrants in [place], [place]. Police request a phone call after psych evaluation to relay disposition.   Doran Durand -- [phone]

## 2022-05-10 NOTE — Progress Notes (Signed)
Patient arrived to room. Room prepared according to suicide protocol. Most of admission completed but patient kept saying that he wished he would have been able to kill himself and that he did not deserve to live. I discussed these issues with patient and encouraged him.
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
## 2022-05-10 NOTE — Transfer of Care (Signed)
Immediate Anesthesia Transfer of Care Note  Patient: [name]  Procedure(s) Performed: EXPLORATION OF NECK WOUND W/CLOSURE (Neck)  Patient Location: PACU  Anesthesia Type:General  Level of Consciousness: awake, drowsy, and patient cooperative  Airway & Oxygen Therapy: Patient Spontanous Breathing  Post-op Assessment: Report given to RN and Post -op Vital signs reviewed and stable  Post vital signs: Reviewed and stable  Last Vitals:  Vitals Value Taken Time  BP 137/76 05/10/22 1541  Temp    Pulse 80 05/10/22 1544  Resp 24 05/10/22 1544  SpO2 94 % 05/10/22 1544  Vitals shown include unvalidated device data.  Last Pain:  Vitals:   05/10/22 1429  TempSrc:   PainSc: 2       Patients Stated Pain Goal: 0 ([id number])  Complications: No notable events documented.
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
## 2022-05-10 NOTE — TOC CAGE-AID Note (Incomplete)
Transition of Care [facility]) - CAGE-AID Screening   Patient Details  Name: Adam Robinson MRN: [id number] Date of Birth: 08/04/1985  Transition of [facility] CM/SW Contact:    Clovis Cao, RN Phone Number: [phone] 05/10/2022, 5:54 PM   Clinical Narrative: Pt here after slicing his neck after SI attempt.  Pt admits to smoking mariajuana but denies alcohol use or other recreational drug use.     CAGE-AID Screening:    Have You Ever Felt You Ought to Cut Down on Your Drinking or Drug Use?: No Have People Annoyed You By Critizing Your Drinking Or Drug Use?: No Have You Felt Bad Or Guilty About Your Drinking Or Drug Use?: No Have You Ever Had a Drink or Used Drugs First Thing In The Morning to Steady Your Nerves or to Get Rid of a Hangover?: No CAGE-AID Score: 0  Substance Abuse Education Offered: No

## 2022-05-10 NOTE — Anesthesia Preprocedure Evaluation (Addendum)
Anesthesia Evaluation  Patient identified by MRN, date of birth, ID band Patient awake    Reviewed: Allergy & Precautions, NPO status , Patient's Chart, lab work & pertinent test results  Airway Mallampati: II  TM Distance: >3 FB Neck ROM: Full    Dental no notable dental hx.    Pulmonary neg pulmonary ROS   Pulmonary exam normal        Cardiovascular negative cardio ROS  Rhythm:Regular Rate:Normal     Neuro/Psych negative neurological ROS  negative psych ROS   GI/Hepatic negative GI ROS, Neg liver ROS,,,  Endo/Other  negative endocrine ROS    Renal/GU negative Renal ROS  negative genitourinary   Musculoskeletal Neck laceration   Abdominal Normal abdominal exam  (+)   Peds  Hematology negative hematology ROS (+)   Anesthesia Other Findings   Reproductive/Obstetrics                             Anesthesia Physical Anesthesia Plan  ASA: 1 and emergent  Anesthesia Plan: General   Post-op Pain Management:    Induction: Intravenous and Rapid sequence  PONV Risk Score and Plan: 2 and Ondansetron, Dexamethasone, Midazolam and Treatment may vary due to age or medical condition  Airway Management Planned: Mask and Oral ETT  Additional Equipment: None  Intra-op Plan:   Post-operative Plan: Extubation in OR  Informed Consent: I have reviewed the patients History and Physical, chart, labs and discussed the procedure including the risks, benefits and alternatives for the proposed anesthesia with the patient or authorized representative who has indicated his/her understanding and acceptance.     Dental advisory given  Plan Discussed with: CRNA  Anesthesia Plan Comments: (Lab Results      Component                Value               Date                      WBC                      12.3 (H)            05/10/2022                HGB                      14.6                05/10/2022                 HCT                      43.0                05/10/2022                MCV                      84.2                05/10/2022                PLT                      370  05/10/2022             Lab Results      Component                Value               Date                      NA                       143                 05/10/2022                K                        3.7                 05/10/2022                GLUCOSE                  132 (H)             05/10/2022                BUN                      17                  05/10/2022                CREATININE               1.20                05/10/2022           )       Anesthesia Quick Evaluation

## 2022-05-10 NOTE — Progress Notes (Signed)
Orthopedic Tech Progress Note Patient Details:  Adam Robinson 03/26/1874 [id number]  Level 1 trauma, ortho techs present upon pt arrival.   Patient ID: Adam Robinson, male   DOB: 03/26/1874, 36 y.o.   MRN: [id number]  [name] 05/10/2022, 1:27 PM

## 2022-05-10 NOTE — Progress Notes (Signed)
   05/10/22 1325  Spiritual Encounters  Type of Visit Initial  Care provided to: Patient  Conversation partners present during encounter Nurse  Referral source Trauma page  Reason for visit Trauma  OnCall Visit No  Spiritual Framework  Presenting Themes Meaning/purpose/sources of inspiration;Values and beliefs;Significant life change (Suicide)  Community/Connection Family  Patient Stress Factors Exhausted;Family relationships;Financial concerns;Major life changes  Interventions  Spiritual Care Interventions Made Compassionate presence;Reflective listening;Normalization of emotions;Explored values/beliefs/practices/strengths;Prayer;Encouragement  Intervention Outcomes  Outcomes Connection to spiritual care   Chp responded to Trauma page.  Spoke with Pt after immediate medical needs were addressed.  Pt. Attempted suicide and was still expressing suicidal thoughts.  Chp spoke with pt about what lead up to suicide attempt. Pt. Expressed frustration with recent life events making him feel worthless and helpless.  Spoke of a pornography habit that "lead him down a dark path" according to him.  Pt. Expressed that he felt he was wicked and evil in his heart and mind.  Chp sought to encourage him that there was still hope and reason to live.  Chp. Reminded pt of his children. Pt spoke of early expose to christianity when he was young and asked for prayer. Chp prayed with patient.
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
## 2022-05-10 NOTE — Anesthesia Procedure Notes (Signed)
Procedure Name: Intubation Date/Time: 05/10/2022 2:45 PM  Performed by: Dorthea Cove, CRNAPre-anesthesia Checklist: Patient identified, Emergency Drugs available, Suction available and Patient being monitored Patient Re-evaluated:Patient Re-evaluated prior to induction Oxygen Delivery Method: Circle system utilized Preoxygenation: Pre-oxygenation with 100% oxygen Induction Type: IV induction, Cricoid Pressure applied and Rapid sequence Laryngoscope Size: Mac and 4 Grade View: Grade II Tube type: Oral Tube size: 7.5 mm Number of attempts: 1 Airway Equipment and Method: Stylet and Oral airway Placement Confirmation: ETT inserted through vocal cords under direct vision, positive ETCO2 and breath sounds checked- equal and bilateral Secured at: 23 cm Tube secured with: Tape Dental Injury: Teeth and Oropharynx as per pre-operative assessment
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
## 2022-05-11 ENCOUNTER — Encounter (HOSPITAL_COMMUNITY): Payer: Self-pay | Admitting: General Surgery

## 2022-05-11 DIAGNOSIS — F6589 Other paraphilias: Secondary | ICD-10-CM | POA: Diagnosis present

## 2022-05-11 DIAGNOSIS — X789XXA Intentional self-harm by unspecified sharp object, initial encounter: Secondary | ICD-10-CM | POA: Diagnosis present

## 2022-05-11 DIAGNOSIS — S1183XA Puncture wound without foreign body of other specified part of neck, initial encounter: Secondary | ICD-10-CM | POA: Diagnosis not present

## 2022-05-11 LAB — CBC
HCT: 40.1 % (ref 39.0–52.0)
Hemoglobin: 13.2 g/dL (ref 13.0–17.0)
MCH: 26.7 pg (ref 26.0–34.0)
MCHC: 32.9 g/dL (ref 30.0–36.0)
MCV: 81.2 fL (ref 80.0–100.0)
Platelets: 359 10*3/uL (ref 150–400)
RBC: 4.94 MIL/uL (ref 4.22–5.81)
RDW: 13.5 % (ref 11.5–15.5)
WBC: 11.5 10*3/uL — ABNORMAL HIGH (ref 4.0–10.5)
nRBC: 0 % (ref 0.0–0.2)

## 2022-05-11 LAB — BASIC METABOLIC PANEL
Anion gap: 11 (ref 5–15)
BUN: 10 mg/dL (ref 6–20)
CO2: 20 mmol/L — ABNORMAL LOW (ref 22–32)
Calcium: 9.3 mg/dL (ref 8.9–10.3)
Chloride: 105 mmol/L (ref 98–111)
Creatinine, Ser: 0.82 mg/dL (ref 0.61–1.24)
GFR, Estimated: >60 mL/min (ref >60)
Glucose, Bld: 122 mg/dL — ABNORMAL HIGH (ref 70–99)
Potassium: 3.8 mmol/L (ref 3.5–5.1)
Sodium: 136 mmol/L (ref 135–145)

## 2022-05-11 MED ORDER — POLYETHYLENE GLYCOL 3350 17 G PO PACK: 17.0000 g | PACK | Freq: Every day | ORAL | Status: DC | PRN

## 2022-05-11 NOTE — Progress Notes (Signed)
LCSW Progress Note  [id number]   Adam Robinson  05/11/2022  3:53 PM  Description:   Inpatient Psychiatric Referral  Patient was recommended inpatient per [name], MD. There are no available beds at [facility]. Patient was referred to the following facilities:   Destination  Service Provider Address Phone Fax  [address] [place] [address] [phone]  [facility] [place]  [place], [place] [address] [phone] [place]  [address] [place] [address] [phone]  [facility]  [place], [place] [address] [phone]  [facility]  [address] [place] [address] [phone] [place]  [place], [place] [address] [phone] [facility]  [id number] [place] [address] [phone] [facility]  [address] [place], [place] [address] [phone] [place] [address] [place] [address] [place]  [facility]  [address] [place] [address] [phone]  [facility]  [address] [place] [address] [phone]  [place] [place] [address] [phone]  [facility]  [place] [address] [phone]  [facility]  [address] [place] [address] [phone] [facility]  [address] [place] [address] [phone] [facility]  [address], [place] [address] [place]  [address] [place] [address] [phone]  [facility]  [address] [place] [address] [id number]  [facility] Office  [address], [place] [address] [phone]  [facility]  [address] [place] [address] [place]  [facility]  [address] [place] [address] [phone] [place]  [address], [place] [address] [phone]  [place]  [address] [place] [address] [id number]  [facility]  [address], [place] [address] [phone]  [facility]  [address] [place] [address] [phone]    Situation ongoing, CSW to continue following and update chart as more information becomes available.      Adam Robinson  05/11/2022 3:53 PM
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
## 2022-05-11 NOTE — Assessment & Plan Note (Signed)
Suicide attempt
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
## 2022-05-11 NOTE — Progress Notes (Signed)
Patient was somnolent  all day. He had his covers pulled over his head and stated that he just wanted to sleep. I offered him Tylenol and his scheduled meds but he stated that he was not in pain and just wanted to sleep.

## 2022-05-11 NOTE — Anesthesia Postprocedure Evaluation (Signed)
Anesthesia Post Note  Patient: Adam Robinson  Procedure(s) Performed: EXPLORATION OF NECK WOUND W/CLOSURE (Neck)     Patient location during evaluation: PACU Anesthesia Type: General Level of consciousness: awake and alert Pain management: pain level controlled Vital Signs Assessment: post-procedure vital signs reviewed and stable Respiratory status: spontaneous breathing, nonlabored ventilation, respiratory function stable and patient connected to nasal cannula oxygen Cardiovascular status: blood pressure returned to baseline and stable Postop Assessment: no apparent nausea or vomiting Anesthetic complications: no   No notable events documented.  Last Vitals:  Vitals:   05/11/22 0400 05/11/22 0738  BP: (!) 141/95 125/82  Pulse: 81 71  Resp: 20 17  Temp: 37 C 36.9 C  SpO2: 100% 98%    Last Pain:  Vitals:   05/11/22 0738  TempSrc: Oral  PainSc:                  March Rummage Shareese Macha
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
## 2022-05-11 NOTE — Plan of Care (Signed)
  Problem: Education: Goal: Knowledge of General Education information will improve Description: Including pain rating scale, medication(s)/side effects and non-pharmacologic comfort measures Outcome: Progressing   Problem: Education: Goal: Ability to make informed decisions regarding treatment will improve Outcome: Progressing   Problem: Self-Concept: Goal: Ability to disclose and discuss suicidal ideas will improve Outcome: Progressing

## 2022-05-11 NOTE — TOC CM/SW Note (Signed)
Note on chart states to call Doran Durand with update after psych eval; phone: [phone].  Information cannot be given to law enforcement without an order to disclose.  Will follow up with security director to ensure that information is shared appropriately and with correct documentation.   Reinaldo Raddle, RN, BSN  Trauma/Neuro ICU Case Manager [phone]

## 2022-05-12 DIAGNOSIS — S1183XA Puncture wound without foreign body of other specified part of neck, initial encounter: Secondary | ICD-10-CM | POA: Diagnosis not present

## 2022-05-12 MED ORDER — BOOST / RESOURCE BREEZE PO LIQD CUSTOM
1.0000 | Freq: Three times a day (TID) | ORAL | Status: DC
  Administered 2022-05-12 – 2022-05-15 (×7): 1 via ORAL

## 2022-05-12 MED ORDER — HYDROXYZINE HCL 25 MG PO TABS
25.0000 mg | ORAL_TABLET | Freq: Three times a day (TID) | ORAL | Status: DC
  Administered 2022-05-12 – 2022-05-15 (×9): 25 mg via ORAL
  Filled 2022-05-12 (×10): qty 1

## 2022-05-12 NOTE — Progress Notes (Signed)
Received a call from [place] from [facility]. She stated  they accepted the pt. She requested that pt will need to have IVC paperwork, UA, urine drug screen and Covid test done prior to transferring the pt. On call provider notified, order received for urine drug screen and COVID swab. Both test preformed and sent to lab. Pending result. Per [facility] phone number: [phone]. Fax Number: [phone]

## 2022-05-12 NOTE — Progress Notes (Signed)
Patient made aware that he can have a visitor one person at a time with 30 minute time restriction per DON instruction.
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
## 2022-05-12 NOTE — Plan of Care (Signed)
  Problem: Education: Goal: Knowledge of General Education information will improve Description: Including pain rating scale, medication(s)/side effects and non-pharmacologic comfort measures Outcome: Progressing   Problem: Health Behavior/Discharge Planning: Goal: Ability to manage health-related needs will improve Outcome: Progressing   Problem: Clinical Measurements: Goal: Ability to maintain clinical measurements within normal limits will improve Outcome: Progressing Goal: Will remain free from infection Outcome: Progressing Goal: Diagnostic test results will improve Outcome: Progressing Goal: Respiratory complications will improve Outcome: Progressing Goal: Cardiovascular complication will be avoided Outcome: Progressing   Problem: Activity: Goal: Risk for activity intolerance will decrease Outcome: Progressing   Problem: Nutrition: Goal: Adequate nutrition will be maintained Outcome: Progressing   Problem: Coping: Goal: Level of anxiety will decrease Outcome: Progressing   Problem: Elimination: Goal: Will not experience complications related to bowel motility Outcome: Progressing Goal: Will not experience complications related to urinary retention Outcome: Progressing   Problem: Pain Managment: Goal: General experience of comfort will improve Outcome: Progressing   Problem: Safety: Goal: Ability to remain free from injury will improve Outcome: Progressing   Problem: Skin Integrity: Goal: Risk for impaired skin integrity will decrease Outcome: Progressing   Problem: Education: Goal: Ability to make informed decisions regarding treatment will improve Outcome: Progressing   Problem: Self-Concept: Goal: Ability to disclose and discuss suicidal ideas will improve Outcome: Progressing Goal: Will verbalize positive feelings about self Outcome: Progressing
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
## 2022-05-13 DIAGNOSIS — S1183XA Puncture wound without foreign body of other specified part of neck, initial encounter: Secondary | ICD-10-CM | POA: Diagnosis not present

## 2022-05-13 LAB — URINALYSIS, ROUTINE W REFLEX MICROSCOPIC
Glucose, UA: NEGATIVE mg/dL
Hgb urine dipstick: NEGATIVE
Ketones, ur: 40 mg/dL — AB
Nitrite: NEGATIVE
Protein, ur: NEGATIVE mg/dL
Specific Gravity, Urine: 1.025 (ref 1.005–1.030)
pH: 6 (ref 5.0–8.0)

## 2022-05-13 LAB — URINALYSIS, MICROSCOPIC (REFLEX)

## 2022-05-13 LAB — SARS CORONAVIRUS 2 (TAT 6-24 HRS): SARS Coronavirus 2: NEGATIVE

## 2022-05-13 MED ORDER — METRONIDAZOLE 500 MG PO TABS
2000.0000 mg | ORAL_TABLET | Freq: Once | ORAL | Status: AC
  Administered 2022-05-13: 2000 mg via ORAL
  Filled 2022-05-13: qty 4

## 2022-05-13 NOTE — Progress Notes (Signed)
Per NT, patient has refused vital signs, also refused complete physical assessment.

## 2022-05-13 NOTE — Plan of Care (Signed)
  Problem: Coping: Goal: Level of anxiety will decrease Outcome: Not Progressing   

## 2022-05-13 NOTE — Progress Notes (Signed)
Andrey Campanile of [facility] Police [employer] notified that there was no ORDER TO DISCLOSE and there is no information to be shared without that. He asked if there was any way to email him a copy of one- that they do not use that in [facility].   Phone # [phone] Email- [email or web address]   Rolene Arbour, RN  Trauma Response Nurse [phone]

## 2022-05-13 NOTE — Progress Notes (Signed)
Cup and new urinal given to patient, instructed to collect urine for urinalysis as it wasn't done yet, patient not pleased to hear news as he already gave specimen earlier for urine drug screen, educated patient that result will come out fast as long as we get the specimen to laboratory.
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
## 2022-05-14 DIAGNOSIS — S1183XA Puncture wound without foreign body of other specified part of neck, initial encounter: Secondary | ICD-10-CM | POA: Diagnosis not present

## 2022-05-14 MED ORDER — HALOPERIDOL LACTATE 5 MG/ML IJ SOLN: 5.0000 mg | Freq: Four times a day (QID) | INTRAMUSCULAR | Status: DC | PRN

## 2022-05-14 MED ORDER — ACETAMINOPHEN 325 MG PO TABS: 650.0000 mg | ORAL_TABLET | Freq: Four times a day (QID) | ORAL | Status: AC | PRN

## 2022-05-14 MED ORDER — LIP MEDEX EX OINT
TOPICAL_OINTMENT | CUTANEOUS | Status: DC | PRN
  Filled 2022-05-14 (×2): qty 7

## 2022-05-14 MED ORDER — HYDROXYZINE HCL 25 MG PO TABS: 25.0000 mg | ORAL_TABLET | Freq: Three times a day (TID) | ORAL | 0 refills | Status: AC

## 2022-05-14 NOTE — Progress Notes (Signed)
Chaplain responded to Spiritual Care consult. Chaplain introduced spiritual care and acknowledged several consults placed over the weekend. Pt shared that he was surprised because chaplain had "popped up" on him. Chaplain acknowledged the difficulty of being a patient and beholden to someone else's schedule, often being surprised by people who want to speak to you right now and longing for visits when they're unavailable. Pt requested that chaplain return at another time. Chaplain inquired about possible transfer to another facility and pt stated that he thinks he is supposed to go to [facility] today. Chaplain encouraged pt to request chaplain be paged for a more timely response.  Please page as further needs arise.  Donald Prose. Elyn Peers, M.Div. [facility] Chaplain Pager [phone] Office [phone]

## 2022-05-14 NOTE — TOC Progression Note (Addendum)
Transition of Care [facility]) - Progression Note    Patient Details  Name: [name] MRN: [id number] Date of Birth: 09/08/1985  [facility]) CM/SW Contact  Oren Section Cleta Alberts, RN Phone Number: 05/14/2022, 10:05am  Clinical Narrative:    Received message that patient has been approved for admission to [facility] in [place].  Left message for intake to see if patient can be admitted today.   12:00 Addendum: Left additional message for intake at [facility], requesting call back.   3:50pm Addendum: Able to reach someone in the intake at [facility]: She states that there is no bed available for patient, as no follow-up information was sent to facility.  Notified provider and trauma team.  Faxed IVC paperwork, UA, urine drug screen, and results of COVID test to [phone].  Will provide updates as they are available.   Expected Discharge Plan: Psychiatric Hospital Barriers to Discharge: Continued Medical Work up  Expected Discharge Plan and Services   Discharge Planning Services: CM Consult   Living arrangements for the past 2 months: Single Family Home                                       Social Determinants of Health (SDOH) Interventions SDOH Screenings   Tobacco Use: Low Risk  (05/11/2022)    Readmission Risk Interventions     No data to display         Reinaldo Raddle, RN, BSN  Trauma/Neuro ICU Case Manager [phone]

## 2022-05-14 NOTE — Progress Notes (Addendum)
Inpatient Behavioral Health Placement  At 5:52pm CSW received request from provider Sheran Fava, FNP to assist with pt's disposition to seek inpatient behavioral health placement. Sheran Fava, FNP advised that pt no longer has accepting bed offer with [facility]. CSW informed provider Sheran Fava, FNP pt is a medical floor pt and this ED Psych Disposition CSW will assist with seeking bed placement this evening but will not continue to follow pt due to pt having assigned a TOC CSW or RN CM who will follow pt. This CSW will notify Case Manger Ellan Lambert, RN that CSW assisted this evening with faxing out. CSW will note chart and assist with placement during 2nd shift as requested.    CSW requested that pt be reviewed by [facility] Lynnda Shields, RN and CSW was informed that [facility] has no available beds. This CSW sent referral to out of network providers:   Destination  Service Provider Address Phone Fax  [address] [place] [address] [phone]  [facility] [place]  [place], [place] [address] [phone] [facility]  [address], [place] [address] [phone]  [facility]  [place], [place] [address] [phone]  [facility]  [address] [place] [address] [phone] [place]  [place], [place] [address] [phone] [facility]  [id number] [place] [address] [phone] [facility]  [address] [place], [place] [address] [phone] [place] [address] [place]  [facility]  [address] [place] [address]  [phone]  [facility]  [address] [place] [address] [phone]  [place] [place] [address] [phone]  [facility]  [place] [address] [phone]  [facility]  [address], [place] [address] [phone] [facility]  [address] [place] [address] [phone] [facility]  [address], [place] [address] [place]  [address] [place] [address] [phone]  [facility]  [address] [place] [address] [id number]  [facility]  [address] [place] [address] [phone]  [facility]  [address] [place] [address] [place]  [facility]  [address], [place] [address] [phone] [place]  [address] [place] [address] [phone]  [place]  [address] [place] [address] [place]  [facility]  [address], [place] [address] [phone] [facility]  [address] [place] [address] [place]  [facility]  [address] [place] [address] [phone] [facility]  [address], [place] [address] [id number]      -CSW received phone call from [facility] who denied pt due to documentation stating that pt may have dx of autism, which is a denying qualifier.     Case is working,  [employer] team to continue to assist.    [employer].  Sabra Heck, MSW, [facility] 05/15/2022 12:02 AM

## 2022-05-14 NOTE — Progress Notes (Signed)
Pt explained how important it was to him that his mother come visit today.  Pt stated "she makes me feel better than anyone and I want to see her".  Educated pt on IVC and SI policy.  Pt did very well today for me and stated he was willing to do whatever he needed to do to get better.  Pt was compliant in all patient care.  Notified Psychiatry to follow up.

## 2022-05-14 NOTE — Plan of Care (Signed)
  Problem: Coping: Goal: Level of anxiety will decrease Outcome: Not Progressing   Problem: Self-Concept: Goal: Ability to disclose and discuss suicidal ideas will improve Outcome: Not Progressing Goal: Will verbalize positive feelings about self Outcome: Not Progressing

## 2022-05-14 NOTE — Plan of Care (Signed)
  Problem: Education: Goal: Knowledge of General Education information will improve Description: Including pain rating scale, medication(s)/side effects and non-pharmacologic comfort measures Outcome: Progressing   Problem: Health Behavior/Discharge Planning: Goal: Ability to manage health-related needs will improve Outcome: Progressing   Problem: Clinical Measurements: Goal: Ability to maintain clinical measurements within normal limits will improve Outcome: Progressing Goal: Will remain free from infection Outcome: Progressing Goal: Diagnostic test results will improve Outcome: Progressing Goal: Respiratory complications will improve Outcome: Progressing Goal: Cardiovascular complication will be avoided Outcome: Progressing   Problem: Activity: Goal: Risk for activity intolerance will decrease Outcome: Progressing   Problem: Nutrition: Goal: Adequate nutrition will be maintained Outcome: Progressing   Problem: Coping: Goal: Level of anxiety will decrease Outcome: Progressing   Problem: Elimination: Goal: Will not experience complications related to bowel motility Outcome: Progressing Goal: Will not experience complications related to urinary retention Outcome: Progressing   Problem: Pain Managment: Goal: General experience of comfort will improve Outcome: Progressing   Problem: Safety: Goal: Ability to remain free from injury will improve Outcome: Progressing   Problem: Skin Integrity: Goal: Risk for impaired skin integrity will decrease Outcome: Progressing   Problem: Education: Goal: Ability to make informed decisions regarding treatment will improve Outcome: Progressing   Problem: Self-Concept: Goal: Ability to disclose and discuss suicidal ideas will improve Outcome: Progressing Goal: Will verbalize positive feelings about self Outcome: Progressing
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
## 2022-05-15 DIAGNOSIS — S1183XA Puncture wound without foreign body of other specified part of neck, initial encounter: Secondary | ICD-10-CM | POA: Diagnosis not present

## 2022-05-15 LAB — RESP PANEL BY RT-PCR (RSV, FLU A&B, COVID)  RVPGX2
Influenza A by PCR: NEGATIVE
Influenza B by PCR: NEGATIVE
Resp Syncytial Virus by PCR: NEGATIVE
SARS Coronavirus 2 by RT PCR: NEGATIVE

## 2022-05-15 NOTE — Progress Notes (Signed)
Pt was offered water and Bathroom this AM. Pt declined stating he was okay and did not need anything at this time. Pt has not used the restroom all night. Pt slept comfortably all night with no issues.

## 2022-05-15 NOTE — Progress Notes (Addendum)
Addendum  -CSW update care team that a new COVID will be needed since last COVID was 05/12/22.   Pt was accepted [place] 05/15/22; Bed Assignment [facility]  Pt meets inpatient criteria per Sheran Fava, FNP   Attending Physician will be Dr. Elberta Leatherwood  Report can be called to: [phone]  Pt can arrive after 9:00am   Care Team notified: Sheran Fava, FNP, Denna Haggard, [place], [facility] Case Manger Ellan Lambert, RN, Rolm Gala, RN  Nadara Mode, [place] 05/15/2022 @ 12:14 AM

## 2022-05-15 NOTE — Progress Notes (Signed)
Second attempt to call report to [facility], no answer.

## 2022-05-15 NOTE — Progress Notes (Signed)
Attempted to call report to [facility], no answer

## 2022-05-15 NOTE — TOC Transition Note (Signed)
Transition of Care [facility]) - CM/SW Discharge Note   Patient Details  Name: Maximiliano Muramoto MRN: [id number] Date of Birth: 04-27-1985  Transition of [facility]) CM/SW Contact:  Ella Bodo, RN Phone Number: 05/15/2022, 10:01 AM   Clinical Narrative:    Pt was accepted [place] 05/15/22; Bed Assignment [facility]   Pt meets inpatient criteria per Sheran Fava, FNP    Attending Physician will be Dr. Elberta Leatherwood   Report can be called to: [phone]   Pt can arrive after 9:00am   New covid results called in to central intake at [facility].    Arranged Event organiser transport with Corporal Penrod with [facility]'s office.  They will call [facility] when transport is arranged.  Bedside nurse Amber to call report.     Barriers to Discharge: Continued Medical Work up                       Discharge Plan and Services Additional resources added to the After Visit Summary for     Discharge Planning Services: CM Consult                                 Social Determinants of Health (SDOH) Interventions SDOH Screenings   Tobacco Use: Low Risk  (05/11/2022)     Readmission Risk Interventions     No data to display          Reinaldo Raddle, RN, BSN  Trauma/Neuro ICU Case Manager [phone]

## 2022-05-17 LAB — URINE DRUGS OF ABUSE SCREEN W ALC, ROUTINE (REF LAB)
Amphetamines, Urine: NEGATIVE ng/mL
Barbiturate, Ur: NEGATIVE ng/mL
Cocaine (Metab.): NEGATIVE ng/mL
Ethanol U, Quan: NEGATIVE %
Methadone Screen, Urine: NEGATIVE ng/mL
Opiate Quant, Ur: NEGATIVE ng/mL
Phencyclidine, Ur: NEGATIVE ng/mL
Propoxyphene, Urine: NEGATIVE ng/mL
